# Patient Record
Sex: Male | Born: 1995 | Hispanic: No | Marital: Single | Smoking: Former smoker
Health system: Southern US, Community
[De-identification: ages and names within clinical notes are randomized; demographics above are authoritative.]

## PROBLEM LIST (undated history)

## (undated) HISTORY — PX: HERNIA REPAIR: SHX51

---
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
## 2014-09-18 IMAGING — US US PELVIS LIMITED
1 series · 14 of 25 positions shown · non-contrast
Comparison: none

REASON FOR EXAM: lt testicular swelling pain last 5 days
COMMENTS:

[Series 1: us pelvis limited · 0.09mm/px · 14 of 96 slices shown]
[im 1/96]
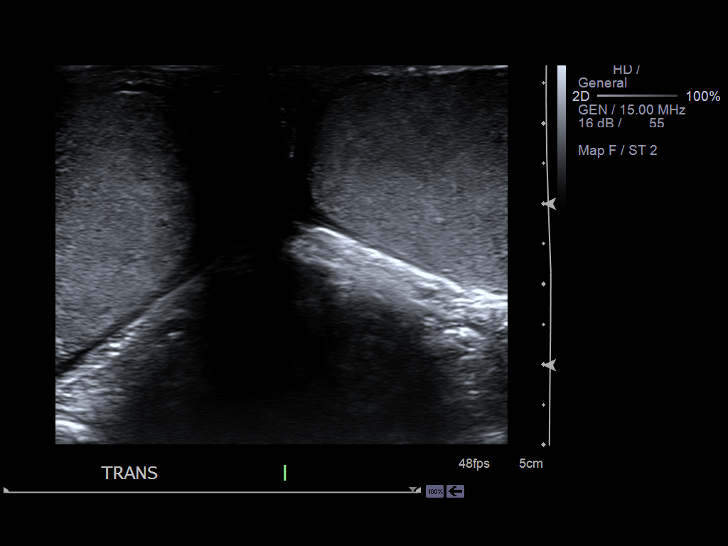
[im 8/96]
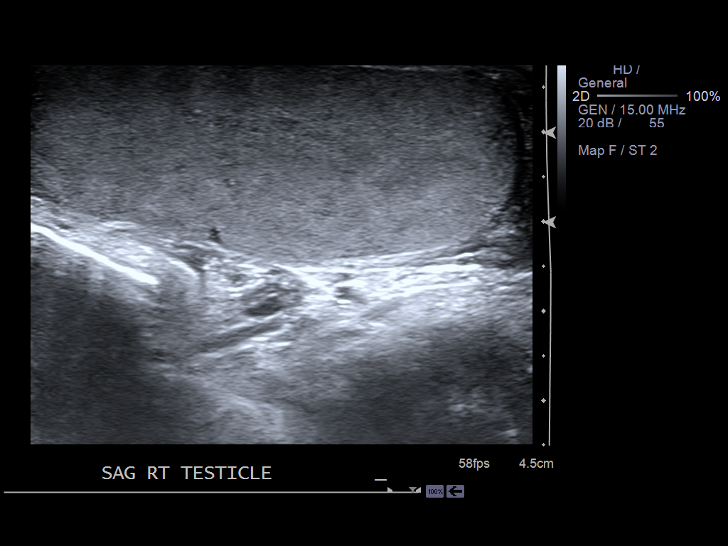
[im 16/96]
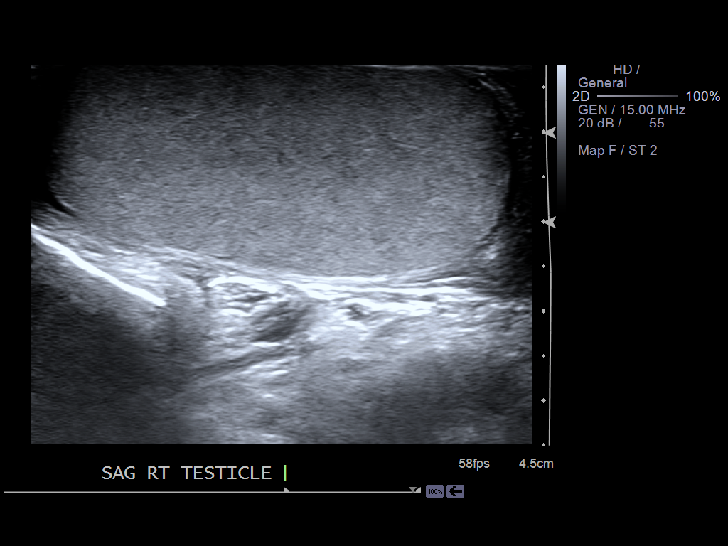
[im 24/96]
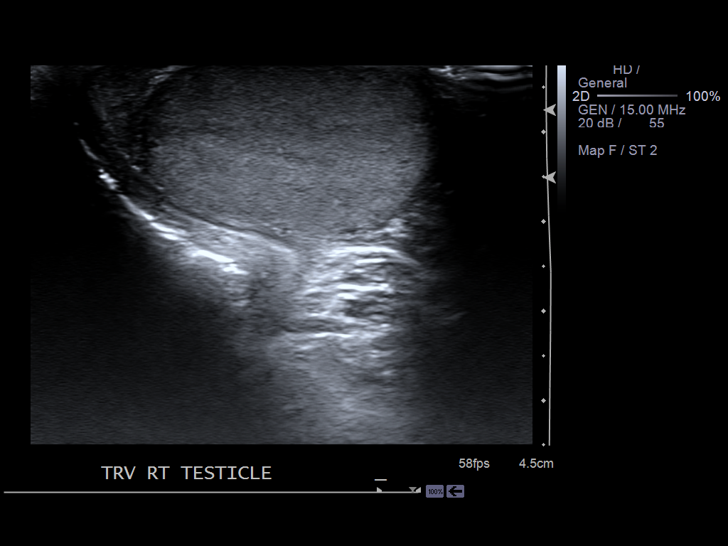
[im 32/96]
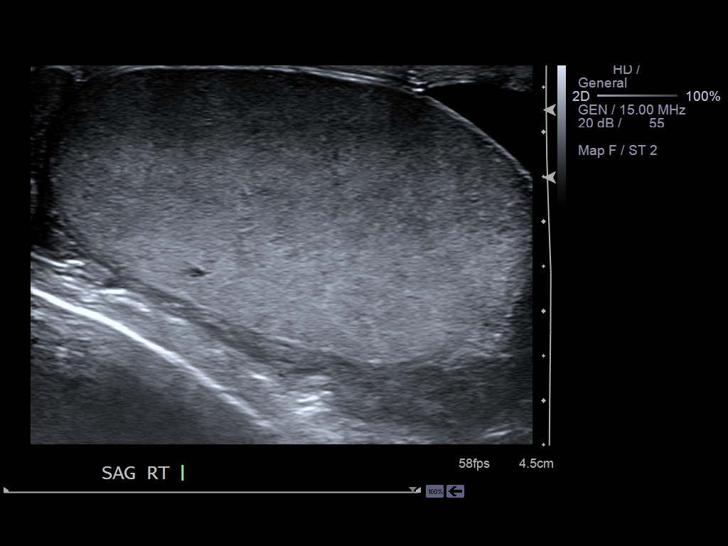
[im 36/96]
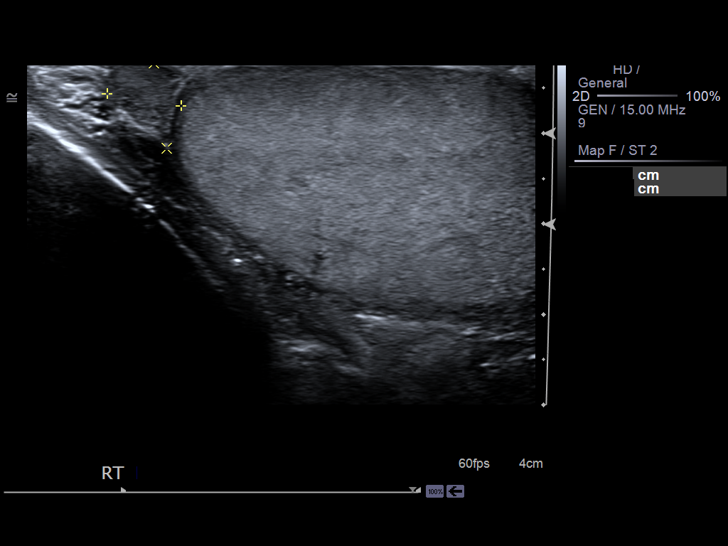
[im 44/96]
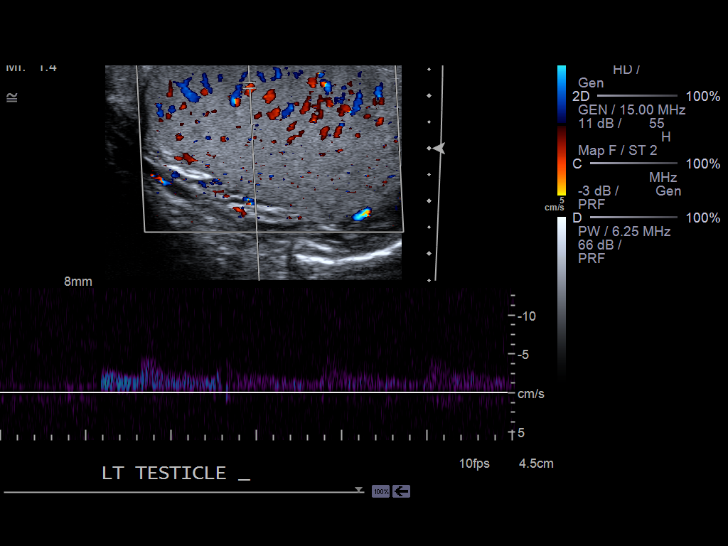
[im 52/96]
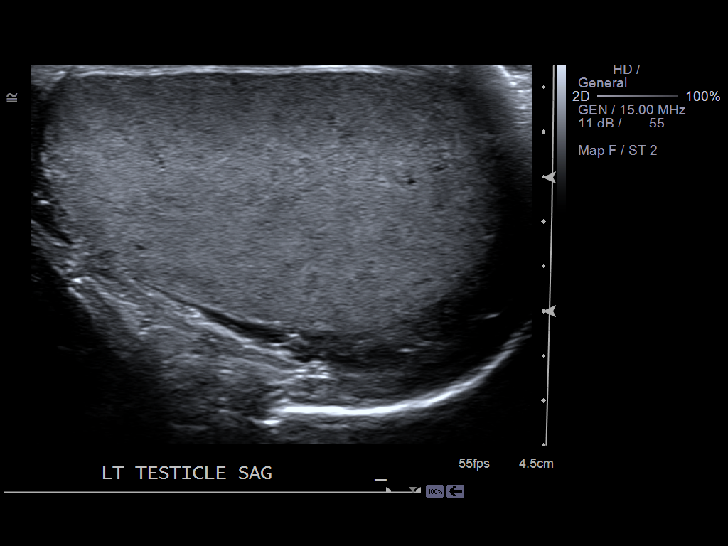
[im 60/96]
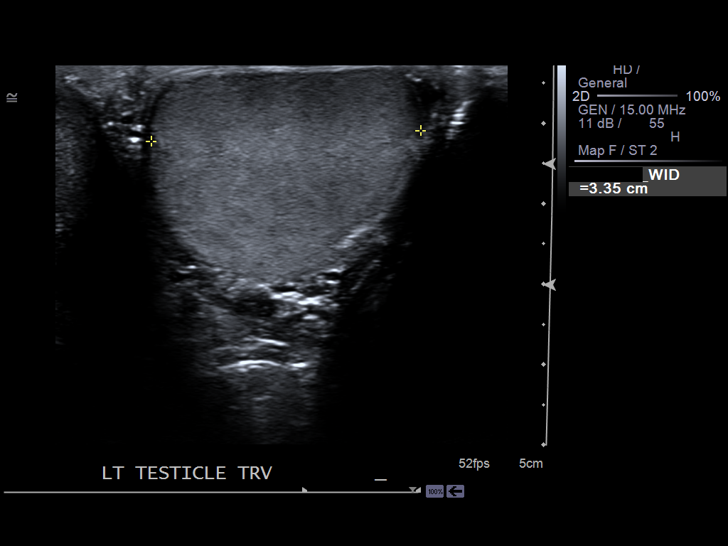
[im 64/96]
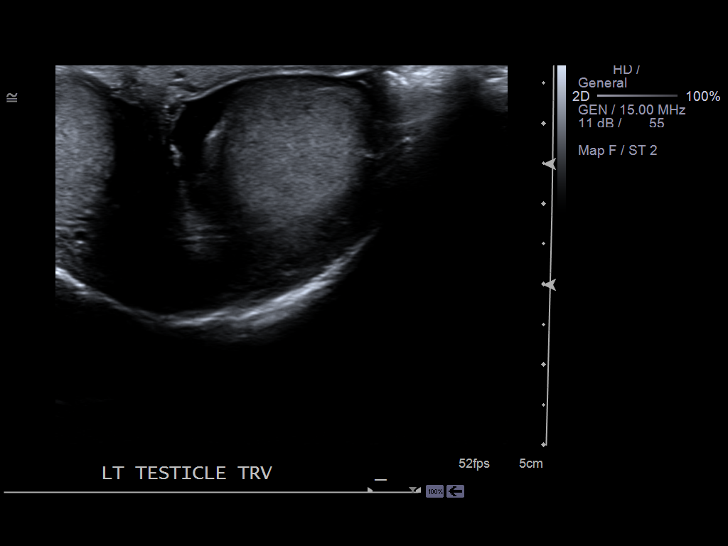
[im 72/96]
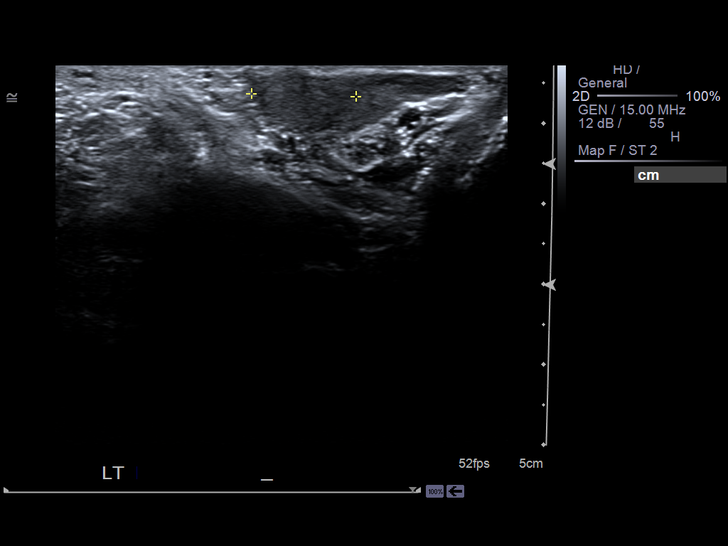
[im 80/96]
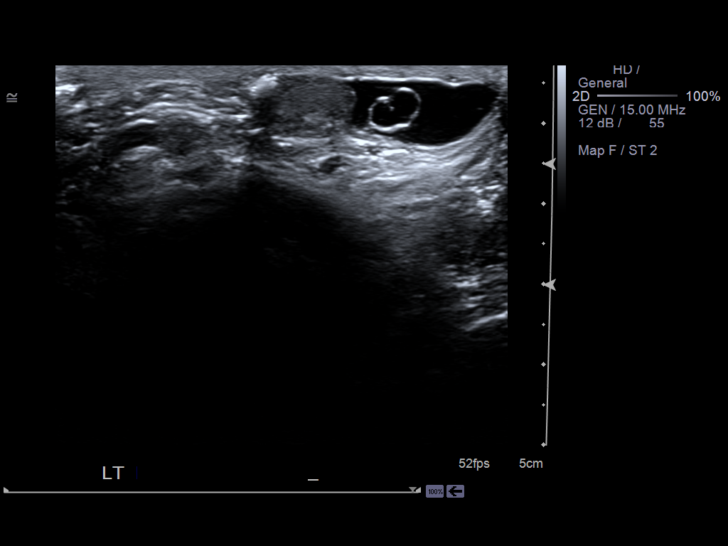
[im 88/96]
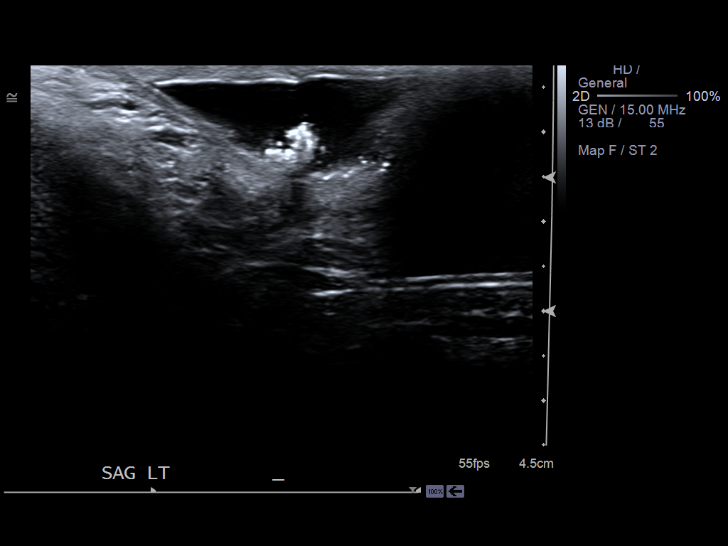
[im 96/96]
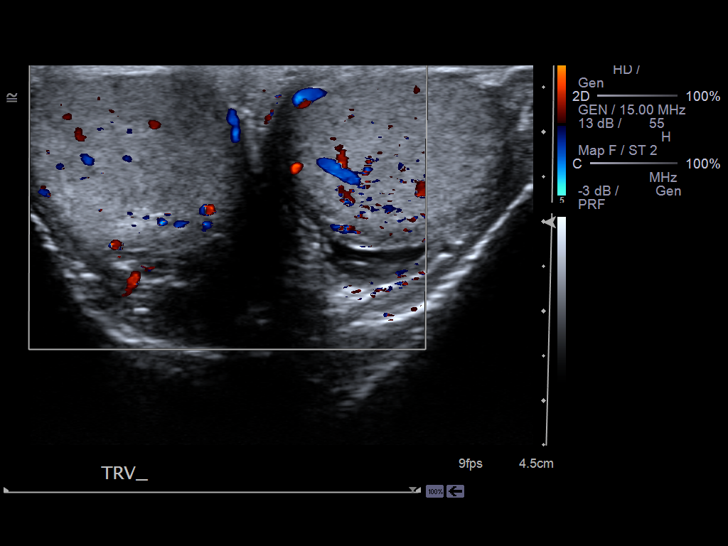

[14 of 25 positions shown; findings below may reference images not displayed]

PROCEDURE:     US  - US TESTICULAR  - January 08, 2012  [DATE]

RESULT:     And testicular sonogram shows the right testicle measures 5.33 x
2.25 x 3.20 cm. The left testicle measures 5.29 x 3.35 x 2.72 cm. Blood flow
is present in both testicles. A there are multiple echogenic shadowing
mobile calcifications in the left scrotum. Calcified loose bodies or
calcifications of scrotal pearls or possibly loose bodies from calcification
of a torsed appendix testis appendix epididymis are considerations. Small
hydrocele is present. Epididymides are normal otherwise.
IMPRESSION: Scrotal calcifications which are mobile in the left
scrotum. Small hydrocele present. No evidence of testicular torsion. Please
see above.

[REDACTED]

## 2019-08-25 ENCOUNTER — Ambulatory Visit (INDEPENDENT_AMBULATORY_CARE_PROVIDER_SITE_OTHER): Payer: Commercial Managed Care - PPO | Admitting: Urology

## 2019-08-25 ENCOUNTER — Other Ambulatory Visit: Payer: Self-pay

## 2019-08-25 ENCOUNTER — Encounter: Payer: Self-pay | Admitting: Urology

## 2019-08-25 VITALS — BP 142/95 | HR 68 | Ht 72.0 in | Wt 173.9 lb

## 2019-08-25 DIAGNOSIS — N50819 Testicular pain, unspecified: Secondary | ICD-10-CM | POA: Diagnosis not present

## 2019-08-25 MED ORDER — SULFAMETHOXAZOLE-TRIMETHOPRIM 800-160 MG PO TABS: 1.0000 | ORAL_TABLET | Freq: Two times a day (BID) | ORAL | 0 refills | Status: AC

## 2019-08-25 NOTE — Progress Notes (Signed)
   08/25/2019 3:55 PM   [employer] 02/22/1995 [id number]  Primary provider: Lorre Munroe, NP [address] [place],  [place] [address]  Chief Complaint  Patient presents with  . Testicle Pain    HPI: Johnny Barrett is a 24 y.o. male who presents for recent ED follow-up.   Had onset left scrotal pain 08/21/2019  Thought his left testis was high [place]  Seen and ED [facility] 7/31  Scrotal ultrasound performed which showed no evidence of torsion.  Testes normal in appearance.  Incidentally noted to have a 3 cm fluid collection with internal debris within the superior lateral wall of the left hemiscrotum thought to potentially represent a developing abscess  No treatment recommended and referral to [facility] placed however was not going to be able to be seen until mid-September  Minimal pain at present  Denies fever or chills  PMH: No past medical history on file.  Surgical History: Past Surgical History:  Procedure Laterality Date  . HERNIA REPAIR      Home Medications:  Allergies as of 08/25/2019   No Known Allergies     Medication List    as of August 25, 2019  3:55 PM   You have not been prescribed any medications.     Allergies: No Known Allergies  Family History: Family History  Problem Relation Age of Onset  . Alcohol abuse Maternal Grandfather   . Alcohol abuse Paternal Grandmother   . Stroke Father   . Cancer Neg Hx   . Diabetes Neg Hx     Social History:  reports that he has been smoking cigarettes. He has never used smokeless tobacco. He reports that he does not drink alcohol and does not use drugs.   Physical Exam: BP (!) 142/95   Pulse 68   Ht 6' (1.829 m)   Wt 173 lb 14.4 oz (78.9 kg)   BMI 23.59 kg/m   Constitutional:  Alert and oriented, No acute distress. HEENT: [place] AT, moist mucus membranes.  Trachea midline, no masses. Cardiovascular: No clubbing, cyanosis, or edema. Respiratory: Normal respiratory effort,  no increased work of breathing. GI: Abdomen is soft, nontender, nondistended, no abdominal masses GU: Phallus without lesions.  Testes descended bilateral without masses or tenderness mild tenderness upper left scrotum however no mass, erythema or fluctuance Skin: No rashes, bruises or suspicious lesions. Neurologic: Grossly intact, no focal deficits, moving all 4 extremities. Psychiatric: Normal mood and affect.   Assessment & Plan:    1.  Scrotal pain  I was able to access chart and [facility] however the ultrasound would not download in PACS  We will treat empirically Septra DS  Follow-up 8/6 for reexam and will attempt to review ultrasound at that visit   Riki Altes, MD  [facility] [address], [address] [place], [place] [address] [phone]

## 2019-08-27 LAB — MICROSCOPIC EXAMINATION
Bacteria, UA: NONE SEEN
Epithelial Cells (non renal): NONE SEEN /hpf (ref 0–10)
RBC: NONE SEEN /hpf (ref 0–2)

## 2019-08-27 LAB — URINALYSIS, COMPLETE
Bilirubin, UA: NEGATIVE
Glucose, UA: NEGATIVE
Ketones, UA: NEGATIVE
Leukocytes,UA: NEGATIVE
Nitrite, UA: NEGATIVE
Protein,UA: NEGATIVE
RBC, UA: NEGATIVE
Specific Gravity, UA: 1.02 (ref 1.005–1.030)
Urobilinogen, Ur: 0.2 mg/dL (ref 0.2–1.0)
pH, UA: 8 — ABNORMAL HIGH (ref 5.0–7.5)

## 2019-08-28 ENCOUNTER — Ambulatory Visit (INDEPENDENT_AMBULATORY_CARE_PROVIDER_SITE_OTHER): Payer: Commercial Managed Care - PPO | Admitting: Urology

## 2019-08-28 ENCOUNTER — Other Ambulatory Visit: Payer: Self-pay

## 2019-08-28 ENCOUNTER — Encounter: Payer: Self-pay | Admitting: Urology

## 2019-08-28 VITALS — BP 129/87 | HR 64 | Ht 72.0 in | Wt 176.8 lb

## 2019-08-28 DIAGNOSIS — N5082 Scrotal pain: Secondary | ICD-10-CM

## 2019-08-29 ENCOUNTER — Encounter: Payer: Self-pay | Admitting: Urology

## 2019-08-29 NOTE — Progress Notes (Signed)
   08/28/2019 12:34 PM   Johnny Barrett [birth year] [id number]  Referring provider: No referring provider defined for this encounter.  Chief Complaint  Patient presents with  . Follow-up  . Testicle Pain    HPI: 24 y.o. male presents for follow-up of scrotal pain and scrotal wall fluid collection on ultrasound.  Refer to prior note 08/25/2019.   Does feel his pain has improved slightly  I was able to review his scrotal ultrasound and there is a 0.5 x 3 cm scrotal wall fluid collection   PMH: No past medical history on file.  Surgical History: Past Surgical History:  Procedure Laterality Date  . HERNIA REPAIR      Home Medications:  Allergies as of 08/28/2019   No Known Allergies     Medication List       Accurate as of August 28, 2019 11:59 PM. If you have any questions, ask your nurse or doctor.        sulfamethoxazole-trimethoprim 800-160 MG tablet Commonly known as: BACTRIM DS Take 1 tablet by mouth 2 (two) times daily for 7 days.       Allergies: No Known Allergies  Family History: Family History  Problem Relation Age of Onset  . Stroke Father   . Kidney cancer Father   . Alcohol abuse Maternal Grandfather   . Alcohol abuse Paternal Grandmother   . Cancer Neg Hx   . Diabetes Neg Hx     Social History:  reports that he has been smoking cigarettes. He has never used smokeless tobacco. He reports that he does not drink alcohol and does not use drugs.   Physical Exam: BP 129/87 (BP Location: Left Arm, Patient Position: Sitting, Cuff Size: Normal)   Pulse 64   Ht 6' (1.829 m)   Wt 176 lb 12.8 oz (80.2 kg)   BMI 23.98 kg/m   Constitutional:  Alert and oriented, No acute distress. HEENT: [place] AT, moist mucus membranes.  Trachea midline, no masses. Cardiovascular: No clubbing, cyanosis, or edema. Respiratory: Normal respiratory effort, no increased work of breathing. GU: Testes descended bilateral without masses or tenderness.  The fluid collection is  not palpable where it was identified in the upper left lateral scrotal wall.  There is no erythema, skin thickening or tenderness   Pertinent Imaging: Scrotal sonogram personally reviewed   Assessment & Plan:    1.  Scrotal pain  Clinical presentation not consistent with abscess  Ultrasonic finding not palpable  We will go ahead and have him complete the antibiotic course since he is started  Follow-up as needed for persistent or worsening pain   Riki Altes, MD  [facility] [address] [place], [place] [address] [phone]

## 2020-01-12 ENCOUNTER — Other Ambulatory Visit: Payer: Self-pay

## 2020-01-12 ENCOUNTER — Encounter: Payer: Self-pay | Admitting: Internal Medicine

## 2020-01-12 ENCOUNTER — Ambulatory Visit (INDEPENDENT_AMBULATORY_CARE_PROVIDER_SITE_OTHER): Payer: Commercial Managed Care - PPO | Admitting: Internal Medicine

## 2020-01-12 VITALS — BP 129/76 | HR 73 | Ht 72.0 in | Wt 180.7 lb

## 2020-01-12 DIAGNOSIS — F419 Anxiety disorder, unspecified: Secondary | ICD-10-CM | POA: Insufficient documentation

## 2020-01-12 DIAGNOSIS — N433 Hydrocele, unspecified: Secondary | ICD-10-CM | POA: Diagnosis not present

## 2020-01-12 DIAGNOSIS — I341 Nonrheumatic mitral (valve) prolapse: Secondary | ICD-10-CM | POA: Diagnosis not present

## 2020-01-12 NOTE — Assessment & Plan Note (Signed)
No symptoms stable at the present time.  Denies any history of tachycardia complains of anxiety.

## 2020-01-12 NOTE — Progress Notes (Signed)
Established Patient Office Visit  Subjective:  Patient ID: Johnny Barrett, male    DOB: 1995-10-28  Age: 24 y.o. MRN: [id number]  CC:  Chief Complaint  Patient presents with  . New Patient (Initial Visit)    HPI  Johnny Barrett presents for general checkup.  He also complains of pain and swelling of the left testicle.  He was treated in [facility] by urologist he was initially treated with sulfa for 1 week and then he was started on doxycycline for 6 weeks.  Patient states that he has trouble with work with prolonged standing and sitting intermittently he denies any history of any abdominal pain trouble passing water or blood in the stool.  According to the patient history he was found to have fluid in the left testicular area which was detected by ultrasound.Marland Kitchen  He is being followed up by neurology clinic in [facility].  He also has a problem with anxiety is a possible history of depression in the family.Marland Kitchen  Ultrasound in the past with me small bilateral hydroceles with prominent lymph nodes 1.8 cm  History reviewed. No pertinent past medical history.  Past Surgical History:  Procedure Laterality Date  . HERNIA REPAIR      Family History  Problem Relation Age of Onset  . Stroke Father   . Kidney cancer Father   . Alcohol abuse Maternal Grandfather   . Alcohol abuse Paternal Grandmother   . Cancer Neg Hx   . Diabetes Neg Hx     Social History   Socioeconomic History  . Marital status: Single    Spouse name: Not on file  . Number of children: Not on file  . Years of education: Not on file  . Highest education level: Not on file  Occupational History  . Not on file  Tobacco Use  . Smoking status: Former Smoker    Types: Cigarettes  . Smokeless tobacco: Never Used  Substance and Sexual Activity  . Alcohol use: No  . Drug use: No  . Sexual activity: Not Currently  Other Topics Concern  . Not on file  Social History Narrative  . Not on file   Social Determinants of  Health   Financial Resource Strain: Not on file  Food Insecurity: Not on file  Transportation Needs: Not on file  Physical Activity: Not on file  Stress: Not on file  Social Connections: Not on file  Intimate Partner Violence: Not on file     Current Outpatient Medications:  .  doxycycline (VIBRAMYCIN) 100 MG capsule, Take 100 mg by mouth 2 (two) times daily., Disp: , Rfl:    No Known Allergies  ROS Review of Systems  Constitutional: Negative.   HENT: Negative.   Eyes: Negative.   Respiratory: Negative.   Cardiovascular: Negative.   Gastrointestinal: Negative.  Negative for blood in stool, diarrhea, nausea, rectal pain and vomiting.  Endocrine: Negative.   Genitourinary: Positive for scrotal swelling and testicular pain. Negative for dysuria, hematuria and penile pain.  Musculoskeletal: Negative.   Skin: Negative.   Allergic/Immunologic: Negative.   Neurological: Negative.   Hematological: Negative.   Psychiatric/Behavioral: The patient is nervous/anxious.   All other systems reviewed and are negative.     Objective:    Physical Exam Vitals reviewed.  Constitutional:      Appearance: Normal appearance.  HENT:     Mouth/Throat:     Mouth: Mucous membranes are moist.  Eyes:     Pupils: Pupils are equal, round, and  reactive to light.  Neck:     Vascular: No carotid bruit.  Cardiovascular:     Rate and Rhythm: Normal rate and regular rhythm.     Pulses: Normal pulses.     Heart sounds: Murmur heard.   Systolic murmur is present with a grade of 1/6. No S3 or S4 sounds.   Pulmonary:     Effort: Pulmonary effort is normal.     Breath sounds: Normal breath sounds.  Abdominal:     General: Bowel sounds are normal.     Palpations: Abdomen is soft. There is no hepatomegaly, splenomegaly or mass.     Tenderness: There is no abdominal tenderness.     Hernia: No hernia is present.  Genitourinary:    Comments: Patient has swelling of the left testicle no swelling in  the right testicle was noted.  Patient is being followed by urologist in [facility]. Musculoskeletal:     Cervical back: Neck supple.     Right lower leg: No edema.     Left lower leg: No edema.  Skin:    Findings: No rash.  Neurological:     Mental Status: He is alert and oriented to person, place, and time.     Motor: No weakness.  Psychiatric:        Mood and Affect: Mood normal.        Behavior: Behavior normal.     BP 129/76   Pulse 73   Ht 6' (1.829 m)   Wt 180 lb 11.2 oz (82 kg)   BMI 24.51 kg/m  Wt Readings from Last 3 Encounters:  01/12/20 180 lb 11.2 oz (82 kg)  08/28/19 176 lb 12.8 oz (80.2 kg)  08/25/19 173 lb 14.4 oz (78.9 kg)     Health Maintenance Due  Topic Date Due  . Hepatitis C Screening  Never done  . COVID-19 Vaccine (1) Never done  . HIV Screening  Never done  . TETANUS/TDAP  Never done  . INFLUENZA VACCINE  Never done    There are no preventive care reminders to display for this patient.  Lab Results  Component Value Date   TSH 0.69 04/17/2013   Lab Results  Component Value Date   WBC 9.0 04/17/2013   HGB 15.3 04/17/2013   HCT 45.6 04/17/2013   MCV 91.1 04/17/2013   PLT 159.0 04/17/2013   Lab Results  Component Value Date   NA 139 04/17/2013   K 4.6 04/17/2013   CO2 28 04/17/2013   GLUCOSE 98 04/17/2013   BUN 11 04/17/2013   CREATININE 1.2 04/17/2013   BILITOT 1.6 (H) 04/17/2013   ALKPHOS 96 04/17/2013   AST 21 04/17/2013   ALT 24 04/17/2013   PROT 7.2 04/17/2013   ALBUMIN 4.7 04/17/2013   CALCIUM 9.9 04/17/2013   GFR 81.16 04/17/2013   No results found for: CHOL No results found for: HDL No results found for: LDLCALC No results found for: TRIG No results found for: CHOLHDL No results found for: WUJW1X    Assessment & Plan:   Problem List Items Addressed This Visit      Cardiovascular and Mediastinum   Mitral prolapse    No symptoms stable at the present time.  Denies any history of tachycardia complains of  anxiety.        Genitourinary   Hydrocele in adult - Primary    Patient has a hydrocele of the left testicle which is being treated with doxycycline at the present time.  Is being followed up by urologist in [facility].        Other   Anxiety    - Patient experiencing high levels of anxiety.  - Encouraged patient to engage in relaxing activities like yoga, meditation, journaling, going for a walk, or participating in a hobby.  - Encouraged patient to reach out to trusted friends or family members about recent struggles          No orders of the defined types were placed in this encounter.   Follow-up: No follow-ups on file.    Corky Downs, MD

## 2020-01-12 NOTE — Assessment & Plan Note (Signed)
Patient has a hydrocele of the left testicle which is being treated with doxycycline at the present time.  Is being followed up by urologist in [facility].

## 2020-01-12 NOTE — Assessment & Plan Note (Signed)
-   Patient experiencing high levels of anxiety.  - Encouraged patient to engage in relaxing activities like yoga, meditation, journaling, going for a walk, or participating in a hobby.  - Encouraged patient to reach out to trusted friends or family members about recent struggles 

## 2020-01-25 ENCOUNTER — Ambulatory Visit: Payer: Commercial Managed Care - PPO | Admitting: Internal Medicine

## 2020-02-01 ENCOUNTER — Ambulatory Visit (INDEPENDENT_AMBULATORY_CARE_PROVIDER_SITE_OTHER): Payer: Commercial Managed Care - PPO | Admitting: Internal Medicine

## 2020-02-01 ENCOUNTER — Encounter: Payer: Self-pay | Admitting: Internal Medicine

## 2020-02-01 ENCOUNTER — Other Ambulatory Visit: Payer: Self-pay

## 2020-02-01 DIAGNOSIS — N433 Hydrocele, unspecified: Secondary | ICD-10-CM

## 2020-02-01 DIAGNOSIS — I341 Nonrheumatic mitral (valve) prolapse: Secondary | ICD-10-CM

## 2020-02-01 DIAGNOSIS — F419 Anxiety disorder, unspecified: Secondary | ICD-10-CM

## 2020-02-01 DIAGNOSIS — F39 Unspecified mood [affective] disorder: Secondary | ICD-10-CM

## 2020-02-01 NOTE — Assessment & Plan Note (Signed)
Patient has a hydrocele of the left testicle.  He is being referred to urologist for further management.  Probably related to surgery with removal.  He was given a note to stay off the work while he has the surgery

## 2020-02-01 NOTE — Assessment & Plan Note (Signed)
Stable at the present time. 

## 2020-02-01 NOTE — Progress Notes (Signed)
Established Patient Office Visit  Subjective:  Patient ID: Johnny Barrett, male    DOB: February 21, 1995  Age: 25 y.o. MRN: [id number]  CC: No chief complaint on file.   HPI  Johnny Barrett presents for hypoesthesia of the left testicle.  Patient has a painful hydrocele of the left testicle he is being referred to a urologist.  Probably is related to his surgery he was given a note for leave of absence during surgical procedure after that.  Patient denies any history of fever chills abdominal pain nausea vomiting back pain or swelling of the legs.  There is no history of lymphadenopathy.  History reviewed. No pertinent past medical history.  Past Surgical History:  Procedure Laterality Date  . HERNIA REPAIR      Family History  Problem Relation Age of Onset  . Stroke Father   . Kidney cancer Father   . Alcohol abuse Maternal Grandfather   . Alcohol abuse Paternal Grandmother   . Cancer Neg Hx   . Diabetes Neg Hx     Social History   Socioeconomic History  . Marital status: Single    Spouse name: Not on file  . Number of children: Not on file  . Years of education: Not on file  . Highest education level: Not on file  Occupational History  . Not on file  Tobacco Use  . Smoking status: Former Smoker    Types: Cigarettes  . Smokeless tobacco: Never Used  Substance and Sexual Activity  . Alcohol use: No  . Drug use: No  . Sexual activity: Not Currently  Other Topics Concern  . Not on file  Social History Narrative  . Not on file   Social Determinants of Health   Financial Resource Strain: Not on file  Food Insecurity: Not on file  Transportation Needs: Not on file  Physical Activity: Not on file  Stress: Not on file  Social Connections: Not on file  Intimate Partner Violence: Not on file     Current Outpatient Medications:  .  doxycycline (VIBRAMYCIN) 100 MG capsule, Take 100 mg by mouth 2 (two) times daily., Disp: , Rfl:    No Known Allergies  ROS Review  of Systems  Constitutional: Negative.   HENT: Negative.   Eyes: Negative.   Respiratory: Negative.   Cardiovascular: Negative.   Gastrointestinal: Negative.   Endocrine: Negative.   Genitourinary: Negative.        Left testicular swelling  Musculoskeletal: Negative.   Skin: Negative.   Allergic/Immunologic: Negative.   Neurological: Negative.   Hematological: Negative.   Psychiatric/Behavioral: Negative.   All other systems reviewed and are negative.     Objective:    Physical Exam Vitals reviewed.  Constitutional:      Appearance: Normal appearance.  HENT:     Mouth/Throat:     Mouth: Mucous membranes are moist.  Eyes:     Pupils: Pupils are equal, round, and reactive to light.  Neck:     Vascular: No carotid bruit.  Cardiovascular:     Rate and Rhythm: Normal rate and regular rhythm.     Pulses: Normal pulses.     Heart sounds: Normal heart sounds.  Pulmonary:     Effort: Pulmonary effort is normal.     Breath sounds: Normal breath sounds.  Abdominal:     General: Bowel sounds are normal.     Palpations: Abdomen is soft. There is no hepatomegaly, splenomegaly or mass.     Tenderness: There is no  abdominal tenderness.     Hernia: No hernia is present.  Genitourinary:    Comments: Hydrocele of the left testicle. Musculoskeletal:     Cervical back: Neck supple.     Right lower leg: No edema.     Left lower leg: No edema.  Skin:    Findings: No rash.  Neurological:     Mental Status: He is alert and oriented to person, place, and time.     Motor: No weakness.  Psychiatric:        Mood and Affect: Mood normal.        Behavior: Behavior normal.     There were no vitals taken for this visit. Wt Readings from Last 3 Encounters:  01/12/20 180 lb 11.2 oz (82 kg)  08/28/19 176 lb 12.8 oz (80.2 kg)  08/25/19 173 lb 14.4 oz (78.9 kg)     Health Maintenance Due  Topic Date Due  . Hepatitis C Screening  Never done  . COVID-19 Vaccine (1) Never done  . HIV  Screening  Never done  . TETANUS/TDAP  Never done  . INFLUENZA VACCINE  Never done    There are no preventive care reminders to display for this patient.  Lab Results  Component Value Date   TSH 0.69 04/17/2013   Lab Results  Component Value Date   WBC 9.0 04/17/2013   HGB 15.3 04/17/2013   HCT 45.6 04/17/2013   MCV 91.1 04/17/2013   PLT 159.0 04/17/2013   Lab Results  Component Value Date   NA 139 04/17/2013   K 4.6 04/17/2013   CO2 28 04/17/2013   GLUCOSE 98 04/17/2013   BUN 11 04/17/2013   CREATININE 1.2 04/17/2013   BILITOT 1.6 (H) 04/17/2013   ALKPHOS 96 04/17/2013   AST 21 04/17/2013   ALT 24 04/17/2013   PROT 7.2 04/17/2013   ALBUMIN 4.7 04/17/2013   CALCIUM 9.9 04/17/2013   GFR 81.16 04/17/2013   No results found for: CHOL No results found for: HDL No results found for: LDLCALC No results found for: TRIG No results found for: CHOLHDL No results found for: WGNF6O    Assessment & Plan:   Problem List Items Addressed This Visit      Cardiovascular and Mediastinum   Mitral prolapse - Primary    Patient has not had any tachycardia or passing out spell denies chest pain.        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  He is being referred to urologist for further management.  Probably related to surgery with removal.  He was given a note to stay off the work while he has the surgery        Other   Mood disorder (HCC)    Stable at the present time.      Anxiety    - Patient experiencing high levels of anxiety.  - Encouraged patient to engage in relaxing activities like yoga, meditation, journaling, going for a walk, or participating in a hobby.  - Encouraged patient to reach out to trusted friends or family members about recent struggles          No orders of the defined types were placed in this encounter.   Follow-up: No follow-ups on file.    Corky Downs, MD

## 2020-02-01 NOTE — Assessment & Plan Note (Addendum)
Patient has not had any tachycardia or passing out spell denies chest pain.

## 2020-02-01 NOTE — Assessment & Plan Note (Signed)
-   Patient experiencing high levels of anxiety.  - Encouraged patient to engage in relaxing activities like yoga, meditation, journaling, going for a walk, or participating in a hobby.  - Encouraged patient to reach out to trusted friends or family members about recent struggles 

## 2020-02-15 ENCOUNTER — Other Ambulatory Visit: Payer: Self-pay

## 2020-02-15 ENCOUNTER — Ambulatory Visit (INDEPENDENT_AMBULATORY_CARE_PROVIDER_SITE_OTHER): Payer: Commercial Managed Care - PPO | Admitting: Internal Medicine

## 2020-02-15 ENCOUNTER — Encounter: Payer: Self-pay | Admitting: Internal Medicine

## 2020-02-15 VITALS — BP 122/80 | HR 82 | Ht 72.0 in | Wt 182.7 lb

## 2020-02-15 DIAGNOSIS — I341 Nonrheumatic mitral (valve) prolapse: Secondary | ICD-10-CM

## 2020-02-15 DIAGNOSIS — F39 Unspecified mood [affective] disorder: Secondary | ICD-10-CM

## 2020-02-15 DIAGNOSIS — N433 Hydrocele, unspecified: Secondary | ICD-10-CM

## 2020-02-15 DIAGNOSIS — F419 Anxiety disorder, unspecified: Secondary | ICD-10-CM

## 2020-02-15 NOTE — Assessment & Plan Note (Signed)
-   Patient experiencing high levels of anxiety.  - Encouraged patient to engage in relaxing activities like yoga, meditation, journaling, going for a walk, or participating in a hobby.  - Encouraged patient to reach out to trusted friends or family members about recent struggles 

## 2020-02-15 NOTE — Assessment & Plan Note (Signed)
Patient has seen the surgeon in [facility] and they are going to withdraw some fluid for testing and after that they are going to decide what kind of surgery he has to go through.  It may get better by withdrawing the fluid or the further surgery, may be needed..  A note has been given to the patient that he might have to go to [facility] on repeated occasions to visit his doctor.

## 2020-02-15 NOTE — Assessment & Plan Note (Signed)
Stable at the present time he denies any palpitation chest pain or shortness of breath.

## 2020-02-15 NOTE — Progress Notes (Addendum)
Established Patient Office Visit  Subjective:  Patient ID: Johnny Barrett, male    DOB: [birth year]  Age: 25 y.o. MRN: [id number]  CC:  Chief Complaint  Patient presents with  . [facility] paper work    HPI  Johnny Barrett presents forlt testicular swelling History reviewed. No pertinent past medical history.  Past Surgical History:  Procedure Laterality Date  . HERNIA REPAIR      Family History  Problem Relation Age of Onset  . Stroke Father   . Kidney cancer Father   . Alcohol abuse Maternal Grandfather   . Alcohol abuse Paternal Grandmother   . Cancer Neg Hx   . Diabetes Neg Hx     Social History   Socioeconomic History  . Marital status: Single    Spouse name: Not on file  . Number of children: Not on file  . Years of education: Not on file  . Highest education level: Not on file  Occupational History  . Not on file  Tobacco Use  . Smoking status: Former Smoker    Types: Cigarettes  . Smokeless tobacco: Never Used  Substance and Sexual Activity  . Alcohol use: No  . Drug use: No  . Sexual activity: Not Currently  Other Topics Concern  . Not on file  Social History Narrative  . Not on file   Social Determinants of Health   Financial Resource Strain: Not on file  Food Insecurity: Not on file  Transportation Needs: Not on file  Physical Activity: Not on file  Stress: Not on file  Social Connections: Not on file  Intimate Partner Violence: Not on file    No current outpatient medications on file.   No Known Allergies  ROS Review of Systems  Constitutional: Negative.   HENT: Negative.   Eyes: Negative.   Respiratory: Negative.   Cardiovascular: Negative.   Gastrointestinal: Negative.   Endocrine: Negative.   Genitourinary: Positive for scrotal swelling and testicular pain.  Musculoskeletal: Negative.   Skin: Negative.   Allergic/Immunologic: Negative.   Neurological: Negative.   Hematological: Negative.   Psychiatric/Behavioral: Negative.    All other systems reviewed and are negative.     Objective:    Physical Exam Genitourinary:    Comments: Left testicular swelling.    BP 122/80   Pulse 82   Ht 6' (1.829 m)   Wt 182 lb 11.2 oz (82.9 kg)   BMI 24.78 kg/m  Wt Readings from Last 3 Encounters:  02/15/20 182 lb 11.2 oz (82.9 kg)  01/12/20 180 lb 11.2 oz (82 kg)  08/28/19 176 lb 12.8 oz (80.2 kg)     Health Maintenance Due  Topic Date Due  . Hepatitis C Screening  Never done  . COVID-19 Vaccine (1) Never done  . HIV Screening  Never done  . TETANUS/TDAP  Never done  . INFLUENZA VACCINE  Never done    There are no preventive care reminders to display for this patient.  Lab Results  Component Value Date   TSH 0.69 04/17/2013   Lab Results  Component Value Date   WBC 9.0 04/17/2013   HGB 15.3 04/17/2013   HCT 45.6 04/17/2013   MCV 91.1 04/17/2013   PLT 159.0 04/17/2013   Lab Results  Component Value Date   NA 139 04/17/2013   K 4.6 04/17/2013   CO2 28 04/17/2013   GLUCOSE 98 04/17/2013   BUN 11 04/17/2013   CREATININE 1.2 04/17/2013   BILITOT 1.6 (H) 04/17/2013   ALKPHOS  96 04/17/2013   AST 21 04/17/2013   ALT 24 04/17/2013   PROT 7.2 04/17/2013   ALBUMIN 4.7 04/17/2013   CALCIUM 9.9 04/17/2013   GFR 81.16 04/17/2013   No results found for: CHOL No results found for: HDL No results found for: LDLCALC No results found for: TRIG No results found for: CHOLHDL No results found for: ZOXW9U    Assessment & Plan:   Problem List Items Addressed This Visit      Cardiovascular and Mediastinum   Mitral prolapse - Primary    Stable at the present time he denies any palpitation chest pain or shortness of breath.        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  It may get better by withdrawing the fluid or the  further surgery, may be needed..  A note has been given to the patient that he might have to go to [facility] on repeated occasions to visit his doctor.        Other   Mood disorder (HCC)    - Patient experiencing high levels of anxiety.  - Encouraged patient to engage in relaxing activities like yoga, meditation, journaling, going for a walk, or participating in a hobby.  - Encouraged patient to reach out to trusted friends or family members about recent struggles       Anxiety    - Patient experiencing high levels of anxiety.  - Encouraged patient to engage in relaxing activities like yoga, meditation, journaling, going for a walk, or participating in a hobby.  - Encouraged patient to reach out to trusted friends or family members about recent struggles          No orders of the defined types were placed in this encounter.   Follow-up: No follow-ups on file.  Patient  Corky Downs, MD

## 2020-02-22 ENCOUNTER — Encounter: Payer: Self-pay | Admitting: Internal Medicine

## 2020-02-22 NOTE — Addendum Note (Signed)
Addended by: Corky Downs on: 02/22/2020 03:57 PM   Modules accepted: Level of Service

## 2020-02-22 NOTE — Assessment & Plan Note (Signed)
-   Patient experiencing high levels of anxiety.  - Encouraged patient to engage in relaxing activities like yoga, meditation, journaling, going for a walk, or participating in a hobby.  - Encouraged patient to reach out to trusted friends or family members about recent struggles
